# Patient Record
Sex: Male | Born: 2000 | Race: Black or African American | Hispanic: No | Marital: Single
Health system: Southern US, Community
[De-identification: ages and names within clinical notes are randomized; demographics above are authoritative.]

## PROBLEM LIST (undated history)

## (undated) DIAGNOSIS — R4020 Unspecified coma: Secondary | ICD-10-CM

## (undated) DIAGNOSIS — R002 Palpitations: Secondary | ICD-10-CM

## (undated) HISTORY — DX: Unspecified coma: R40.20

## (undated) HISTORY — DX: Palpitations: R00.2

---
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
## 2019-02-16 ENCOUNTER — Ambulatory Visit: Payer: Self-pay | Attending: Internal Medicine

## 2019-02-16 DIAGNOSIS — Z20822 Contact with and (suspected) exposure to covid-19: Secondary | ICD-10-CM

## 2019-02-16 DIAGNOSIS — Z20828 Contact with and (suspected) exposure to other viral communicable diseases: Secondary | ICD-10-CM | POA: Insufficient documentation

## 2019-02-18 LAB — NOVEL CORONAVIRUS, NAA: SARS-CoV-2, NAA: NOT DETECTED

## 2019-06-13 ENCOUNTER — Emergency Department (HOSPITAL_COMMUNITY): Payer: 59

## 2019-06-13 ENCOUNTER — Emergency Department (HOSPITAL_COMMUNITY)
Admission: EM | Admit: 2019-06-13 | Discharge: 2019-06-13 | Disposition: A | Discharge status: Home / Self Care | Payer: 59 | Attending: Emergency Medicine | Admitting: Emergency Medicine

## 2019-06-13 ENCOUNTER — Other Ambulatory Visit: Payer: Self-pay

## 2019-06-13 ENCOUNTER — Encounter (HOSPITAL_COMMUNITY): Payer: Self-pay | Admitting: Obstetrics and Gynecology

## 2019-06-13 DIAGNOSIS — F1210 Cannabis abuse, uncomplicated: Secondary | ICD-10-CM | POA: Insufficient documentation

## 2019-06-13 DIAGNOSIS — M542 Cervicalgia: Secondary | ICD-10-CM | POA: Diagnosis present

## 2019-06-13 DIAGNOSIS — Y92410 Unspecified street and highway as the place of occurrence of the external cause: Secondary | ICD-10-CM | POA: Insufficient documentation

## 2019-06-13 DIAGNOSIS — M25512 Pain in left shoulder: Secondary | ICD-10-CM | POA: Insufficient documentation

## 2019-06-13 DIAGNOSIS — M549 Dorsalgia, unspecified: Secondary | ICD-10-CM | POA: Insufficient documentation

## 2019-06-13 DIAGNOSIS — M25532 Pain in left wrist: Secondary | ICD-10-CM | POA: Diagnosis not present

## 2019-06-13 DIAGNOSIS — Y9389 Activity, other specified: Secondary | ICD-10-CM | POA: Insufficient documentation

## 2019-06-13 DIAGNOSIS — Y998 Other external cause status: Secondary | ICD-10-CM | POA: Insufficient documentation

## 2019-06-13 MED ORDER — METHOCARBAMOL 500 MG PO TABS: 500.0000 mg | ORAL_TABLET | Freq: Two times a day (BID) | ORAL | 0 refills | Status: DC

## 2019-06-13 MED ORDER — OXYCODONE-ACETAMINOPHEN 5-325 MG PO TABS
1.0000 | ORAL_TABLET | Freq: Once | ORAL | Status: AC
  Administered 2019-06-13: 17:00:00 1 via ORAL
  Filled 2019-06-13: qty 1

## 2019-06-13 NOTE — Discharge Instructions (Signed)
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
## 2019-10-13 ENCOUNTER — Emergency Department (HOSPITAL_COMMUNITY)
Admission: EM | Admit: 2019-10-13 | Discharge: 2019-10-13 | Disposition: A | Discharge status: Home / Self Care | Payer: 59 | Attending: Emergency Medicine | Admitting: Emergency Medicine

## 2019-10-13 ENCOUNTER — Emergency Department (HOSPITAL_COMMUNITY): Payer: 59

## 2019-10-13 ENCOUNTER — Other Ambulatory Visit: Payer: Self-pay

## 2019-10-13 DIAGNOSIS — R05 Cough: Secondary | ICD-10-CM | POA: Diagnosis not present

## 2019-10-13 DIAGNOSIS — Z5321 Procedure and treatment not carried out due to patient leaving prior to being seen by health care provider: Secondary | ICD-10-CM | POA: Diagnosis not present

## 2019-10-13 DIAGNOSIS — R0789 Other chest pain: Secondary | ICD-10-CM | POA: Insufficient documentation

## 2019-10-13 LAB — CBC
HCT: 45.9 % (ref 39.0–52.0)
Hemoglobin: 15.5 g/dL (ref 13.0–17.0)
MCH: 31.1 pg (ref 26.0–34.0)
MCHC: 33.8 g/dL (ref 30.0–36.0)
MCV: 92 fL (ref 80.0–100.0)
Platelets: 253 10*3/uL (ref 150–400)
RBC: 4.99 MIL/uL (ref 4.22–5.81)
RDW: 12.4 % (ref 11.5–15.5)
WBC: 5 10*3/uL (ref 4.0–10.5)
nRBC: 0 % (ref 0.0–0.2)

## 2019-10-13 LAB — BASIC METABOLIC PANEL
Anion gap: 7 (ref 5–15)
BUN: 11 mg/dL (ref 6–20)
CO2: 28 mmol/L (ref 22–32)
Calcium: 9.4 mg/dL (ref 8.9–10.3)
Chloride: 102 mmol/L (ref 98–111)
Creatinine, Ser: 0.79 mg/dL (ref 0.61–1.24)
GFR calc Af Amer: >60 mL/min (ref >60)
GFR calc non Af Amer: >60 mL/min (ref >60)
Glucose, Bld: 81 mg/dL (ref 70–99)
Potassium: 4.1 mmol/L (ref 3.5–5.1)
Sodium: 137 mmol/L (ref 135–145)

## 2019-10-13 NOTE — ED Triage Notes (Signed)
Pt c/o cont throbbing right sided chest pain 7/10, reproducible with deep breathing x1 day, and non productive cough since this morning.  No cardiac hx.

## 2019-10-16 ENCOUNTER — Encounter (HOSPITAL_COMMUNITY): Payer: Self-pay | Admitting: Emergency Medicine

## 2019-10-16 ENCOUNTER — Emergency Department (HOSPITAL_COMMUNITY)
Admission: EM | Admit: 2019-10-16 | Discharge: 2019-10-16 | Disposition: A | Discharge status: Home / Self Care | Payer: 59 | Attending: Emergency Medicine | Admitting: Emergency Medicine

## 2019-10-16 ENCOUNTER — Other Ambulatory Visit: Payer: Self-pay

## 2019-10-16 DIAGNOSIS — Z79899 Other long term (current) drug therapy: Secondary | ICD-10-CM | POA: Diagnosis not present

## 2019-10-16 DIAGNOSIS — R55 Syncope and collapse: Secondary | ICD-10-CM | POA: Diagnosis not present

## 2019-10-16 DIAGNOSIS — R002 Palpitations: Secondary | ICD-10-CM | POA: Diagnosis present

## 2019-10-16 LAB — BASIC METABOLIC PANEL
Anion gap: 8 (ref 5–15)
BUN: 7 mg/dL (ref 6–20)
CO2: 27 mmol/L (ref 22–32)
Calcium: 9.2 mg/dL (ref 8.9–10.3)
Chloride: 104 mmol/L (ref 98–111)
Creatinine, Ser: 0.97 mg/dL (ref 0.61–1.24)
GFR calc Af Amer: >60 mL/min (ref >60)
GFR calc non Af Amer: >60 mL/min (ref >60)
Glucose, Bld: 84 mg/dL (ref 70–99)
Potassium: 3.7 mmol/L (ref 3.5–5.1)
Sodium: 139 mmol/L (ref 135–145)

## 2019-10-16 LAB — CBC
HCT: 42.4 % (ref 39.0–52.0)
Hemoglobin: 14.1 g/dL (ref 13.0–17.0)
MCH: 30.4 pg (ref 26.0–34.0)
MCHC: 33.3 g/dL (ref 30.0–36.0)
MCV: 91.4 fL (ref 80.0–100.0)
Platelets: 244 10*3/uL (ref 150–400)
RBC: 4.64 MIL/uL (ref 4.22–5.81)
RDW: 12.3 % (ref 11.5–15.5)
WBC: 4.8 10*3/uL (ref 4.0–10.5)
nRBC: 0 % (ref 0.0–0.2)

## 2019-10-16 LAB — TSH: TSH: 1.882 u[IU]/mL (ref 0.350–4.500)

## 2019-10-16 LAB — TROPONIN I (HIGH SENSITIVITY): Troponin I (High Sensitivity): 2 ng/L (ref <18)

## 2019-10-16 NOTE — ED Provider Notes (Signed)
[facility] EMERGENCY DEPARTMENT Provider Note   CSN: [id number] Arrival date & time: 10/16/19  1646     History Chief Complaint  Patient presents with  . Loss of Consciousness    Matthew Allen is a 19 y.o. male.  HPI   This patient is a 19 year old male, he has no significant prior medical history including hypertension diabetes tobacco use alcohol use (rarely), and he has no history of drug abuse.  He does use a CBD variant occasionally.  The patient reports that he is pretty healthy, he used to go to the gym very often until he had to go to the chiropractor several months ago, since then he has not been back to the gym very much.  He reports that on Friday he was having some chest discomfort, and seem to be fleeting and went away, he had gone to the emergency department but because of the extended wait times he decided to go home.  Then again today when he was at work he started to feel like he was lightheaded, near syncopal, became diaphoretic, folic his heart was racing and then had a period where he kind of collapsed to the ground feeling weak in all 4 extremities, numb in all 4 extremities.  Paramedics were called and transported the patient to the emergency department with normal vital signs and a normal blood sugar.  At this time the patient is totally asymptomatic.  There has been no unexpected weight loss, weight gain, constipation, diarrhea, rashes and he states he has not used any stimulant medications including over-the-counter cough and cold medicines, allergy medications or energy drinks.  He specifically denies cocaine and tobacco  Past Medical History:  Diagnosis Date  . Concussion     There are no problems to display for this patient.   History reviewed. No pertinent surgical history.     No family history on file.  Social History   Tobacco Use  . Smoking status: Never Smoker  . Smokeless tobacco: Never Used  Vaping Use  . Vaping Use:  Every day  . Substances: THC, CBD, Flavoring, Nicotine-salt  Substance Use Topics  . Alcohol use: Yes    Comment: Socially  . Drug use: Yes    Types: Marijuana    Home Medications Prior to Admission medications   Medication Sig Start Date End Date Taking? Authorizing Provider  methocarbamol (ROBAXIN) 500 MG tablet Take 1 tablet (500 mg total) by mouth 2 (two) times daily. 06/13/19   Maxwell Caul, PA-C  ondansetron (ZOFRAN ODT) 4 MG disintegrating tablet Take 1 tablet (4 mg total) by mouth every 8 (eight) hours as needed for nausea or vomiting. 09/07/15   Alvira Monday, MD  oxyCODONE-acetaminophen (PERCOCET/ROXICET) 5-325 MG per tablet Take 1-2 tablets by mouth every 6 (six) hours as needed for pain. 07/31/12   Niel Hummer, MD  ranitidine (ZANTAC) 150 MG capsule Take 1 capsule (150 mg total) by mouth daily. 09/07/15   Alvira Monday, MD    Allergies    Patient has no known allergies.  Review of Systems   Review of Systems  All other systems reviewed and are negative.   Physical Exam Updated Vital Signs BP 120/79 (BP Location: Left Arm)   Pulse (!) 58   Temp 98.7 F (37.1 C) (Oral)   Resp 18   Ht 1.753 m (5\' 9" )   Wt 88.5 kg   SpO2 100%   BMI 28.81 kg/m   Physical Exam Vitals and nursing note  reviewed.  Constitutional:      General: He is not in acute distress.    Appearance: He is well-developed.  HENT:     Head: Normocephalic and atraumatic.     Mouth/Throat:     Pharynx: No oropharyngeal exudate.  Eyes:     General: No scleral icterus.       Right eye: No discharge.        Left eye: No discharge.     Conjunctiva/sclera: Conjunctivae normal.     Pupils: Pupils are equal, round, and reactive to light.  Neck:     Thyroid: No thyromegaly.     Vascular: No JVD.  Cardiovascular:     Rate and Rhythm: Normal rate and regular rhythm.     Heart sounds: Normal heart sounds. No murmur heard.  No friction rub. No gallop.   Pulmonary:     Effort: Pulmonary  effort is normal. No respiratory distress.     Breath sounds: Normal breath sounds. No wheezing or rales.  Abdominal:     General: Bowel sounds are normal. There is no distension.     Palpations: Abdomen is soft. There is no mass.     Tenderness: There is no abdominal tenderness.  Musculoskeletal:        General: No tenderness. Normal range of motion.     Cervical back: Normal range of motion and neck supple.  Lymphadenopathy:     Cervical: No cervical adenopathy.  Skin:    General: Skin is warm and dry.     Findings: No erythema or rash.  Neurological:     Mental Status: He is alert.     Coordination: Coordination normal.     Comments: Normal speech coordination strength gait and facial symmetry  Psychiatric:        Behavior: Behavior normal.     ED Results / Procedures / Treatments   Labs (all labs ordered are listed, but only abnormal results are displayed) Labs Reviewed  BASIC METABOLIC PANEL  CBC  URINALYSIS, ROUTINE W REFLEX MICROSCOPIC  TSH  TROPONIN I (HIGH SENSITIVITY)    EKG None  Radiology No results found.  Procedures Procedures (including critical care time)  Medications Ordered in ED Medications - No data to display  ED Course  I have reviewed the triage vital signs and the nursing notes.  Pertinent labs & imaging results that were available during my care of the patient were reviewed by me and considered in my medical decision making (see chart for details).    MDM Rules/Calculators/A&P                          No edema, no tachycardia, normal lung sounds, normal heart sounds, normal pulses, no JVD  This patient has an EKG which is unremarkable, no signs of Brugada or Wellens or any other ischemia or arrhythmia.  He does have a history of some palpitations with this event on Friday and today, he is not currently having no symptoms.  I think he needs to follow-up with cardiology for possible Holter monitor testing, I will add a TSH, the patient  is stable for discharge and aware of the indications for return  The patient has a follow-up with a family doctor tomorrow, he will be given a copy of his EKG as well as his lab work to take with him  Final Clinical Impression(s) / ED Diagnoses Final diagnoses:  Syncope and collapse  Palpitations  Eber Hong, MD 10/16/19 2008

## 2019-10-16 NOTE — ED Triage Notes (Signed)
BIB EMS from work where patient had syncopal episode. Prior to that patient reported dizziness and CP. Patient in NAD.

## 2019-10-16 NOTE — ED Notes (Signed)
Pt sitting outside. 

## 2019-10-16 NOTE — Discharge Instructions (Signed)
Please see your doctor tomorrow at your appointment, you may share with them the EKG and blood work which I have given you, please come back to the emergency department immediately if you develop severe or worsening symptoms including recurrent chest pain palpitations or passing out.  You will need to see the cardiologist, please call the number above for the next available appointment.  You may need a Holter monitor test

## 2019-10-27 ENCOUNTER — Ambulatory Visit (INDEPENDENT_AMBULATORY_CARE_PROVIDER_SITE_OTHER): Payer: 59 | Admitting: Internal Medicine

## 2019-10-27 ENCOUNTER — Telehealth: Payer: Self-pay | Admitting: Radiology

## 2019-10-27 ENCOUNTER — Other Ambulatory Visit: Payer: Self-pay

## 2019-10-27 ENCOUNTER — Encounter: Payer: Self-pay | Admitting: Internal Medicine

## 2019-10-27 VITALS — BP 130/70 | HR 67 | Ht 69.0 in | Wt 210.0 lb

## 2019-10-27 DIAGNOSIS — R55 Syncope and collapse: Secondary | ICD-10-CM | POA: Diagnosis not present

## 2019-10-27 NOTE — Telephone Encounter (Signed)
Enrolled patient for a 14 day Zio XT  monitor to be mailed to patients home  °

## 2019-10-27 NOTE — Patient Instructions (Signed)
Medication Instructions:  Your physician recommends that you continue on your current medications as directed. Please refer to the Current Medication list given to you today.  *If you need a refill on your cardiac medications before your next appointment, please call your pharmacy*   Lab Work: None  If you have labs (blood work) drawn today and your tests are completely normal, you will receive your results only by: Marland Kitchen MyChart Message (if you have MyChart) OR . A paper copy in the mail If you have any lab test that is abnormal or we need to change your treatment, we will call you to review the results.   Testing/Procedures: Your physician has requested that you have an echocardiogram. Echocardiography is a painless test that uses sound waves to create images of your heart. It provides your doctor with information about the size and shape of your heart and how well your heart's chambers and valves are working. This procedure takes approximately one hour. There are no restrictions for this procedure.  Your physician has recommended that you wear a 14 day monitor. These monitors are medical devices that record the heart's electrical activity. Doctors most often use these monitors to diagnose arrhythmias. Arrhythmias are problems with the speed or rhythm of the heartbeat. The monitor is a small, portable device. You can wear one while you do your normal daily activities. This is usually used to diagnose what is causing palpitations/syncope (passing out).  Follow-Up: AS NEEDED   Other Instructions ZIO XT- Long Term Monitor Instructions   Your physician has requested you wear your ZIO patch monitor 14 days.   This is a single patch monitor.  [employer] supplies one patch monitor per enrollment.  Additional stickers are not available.   Please do not apply patch if you will be having a Nuclear Stress Test, Echocardiogram, Cardiac CT, MRI, or Chest Xray during the time frame you would be wearing  the monitor. The patch cannot be worn during these tests.  You cannot remove and re-apply the ZIO XT patch monitor.   Your ZIO patch monitor will be sent USPS Priority mail from [facility] directly to your home address. The monitor may also be mailed to a PO BOX if home delivery is not available.   It may take 3-5 days to receive your monitor after you have been enrolled.   Once you have received you monitor, please review enclosed instructions.  Your monitor has already been registered assigning a specific monitor serial # to you.   Applying the monitor   Shave hair from upper left chest.   Hold abrader disc by orange tab.  Rub abrader in 40 strokes over left upper chest as indicated in your monitor instructions.   Clean area with 4 enclosed alcohol pads .  Use all pads to assure are is cleaned thoroughly.  Let dry.   Apply patch as indicated in monitor instructions.  Patch will be place under collarbone on left side of chest with arrow pointing upward.   Rub patch adhesive wings for 2 minutes.Remove white label marked "1".  Remove white label marked "2".  Rub patch adhesive wings for 2 additional minutes.   While looking in a mirror, press and release button in center of patch.  A small green light will flash 3-4 times .  This will be your only indicator the monitor has been turned on.     Do not shower for the first 24 hours.  You may shower after the first 24  hours.   Press button if you feel a symptom. You will hear a small click.  Record Date, Time and Symptom in the Patient Log Book.   When you are ready to remove patch, follow instructions on last 2 pages of Patient Log Book.  Stick patch monitor onto last page of Patient Log Book.   Place Patient Log Book in [place] box.  Use locking tab on box and tape box closed securely.  The [employer] and [employer] has [employer] on it.  Please place in mailbox as soon as possible.  Your physician should have your test results  approximately 7 days after the monitor has been mailed back to [facility].   Call [facility] Customer Care at [phone] if you have questions regarding your ZIO XT patch monitor.  Call them immediately if you see an orange light blinking on your monitor.   If your monitor falls off in less than 4 days contact our Monitor department at [phone].  If your monitor becomes loose or falls off after 4 days call [employer] at [phone] for suggestions on securing your monitor.

## 2019-10-27 NOTE — Progress Notes (Signed)
Cardiology Office Note:    Date:  10/27/2019   ID:  Matthew Allen, DOB 01/29/2001, MRN [id number]  PCP:  Leilani Able, MD  [facility] Cardiologist:  No primary care provider on file.  [facility] Electrophysiologist:  None   Referring MD: Eber Hong, MD   No chief complaint on file. Seen at the behest of Dr. Eber Hong in consultation for syncope.  History of Present Illness:    Matthew Allen is a 19 y.o. male with episode of syncope 09/2019.  Patient had feel generalized fatigue and chest tightness and chest pain prior to 10/13/19.  Positional and worse with inspiration.  No shortness of breath.  Continued to worsen with chest tightness and pain.  No fevers or chills, noted dry sinuses.  Considered an urgent care.  At work, goes to walk to the back of the room, his head starts swimming, and feels faint/presyncopal.  Feels his heart race and throb, felt numb.  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  Seen in ED:  Sugars ok.  ECG no acute pathology.  No issues since.  Chest pain resolved, no near syncope or syncope since.  No palpitations since. Family history fo syncope and death as bellow.  Past Medical History:  Diagnosis Date  . Concussion   . Loss of consciousness (HCC)   . Palpitations     No past surgical history on file.  Current Medications: Current Meds  Medication Sig  . acetaminophen (TYLENOL) 500 MG tablet Take 500 mg by mouth as needed for headache.  . Biotin 1000 MCG CHEW Chew by mouth daily.  Marland Kitchen ibuprofen (ADVIL) 600 MG tablet Take 600 mg by mouth as needed.   Allergies:   Patient has no known allergies.   Social History   Socioeconomic History  . Marital status: Single    Spouse name: Not on file  . Number of children: Not on file  . Years of education: Not on file  . Highest education level: Not on file  Occupational History  . Not on file  Tobacco Use  . Smoking status: Never  Smoker  . Smokeless tobacco: Never Used  Vaping Use  . Vaping Use: Every day  . Substances: THC, CBD, Flavoring, Nicotine-salt  Substance and Sexual Activity  . Alcohol use: Yes    Comment: Socially  . Drug use: Yes    Types: Marijuana  . Sexual activity: Yes  Other Topics Concern  . Not on file  Social History Narrative  . Not on file   Social Determinants of Health   Financial Resource Strain:   . Difficulty of Paying Living Expenses: Not on file  Food Insecurity:   . Worried About Programme researcher, broadcasting/film/video in the Last Year: Not on file  . Ran Out of Food in the Last Year: Not on file  Transportation Needs:   . Lack of Transportation (Medical): Not on file  . Lack of Transportation (Non-Medical): Not on file  Physical Activity:   . Days of Exercise per Week: Not on file  . Minutes of Exercise per Session: Not on file  Stress:   . Feeling of Stress : Not on file  Social Connections:   . Frequency of Communication with Friends and Family: Not on file  . Frequency of Social Gatherings with Friends and Family: Not on file  . Attends Religious Services: Not on file  . Active Member of Clubs or  Organizations: Not on file  . Attends Banker Meetings: Not on file  . Marital Status: Not on file    Family History: The patient's family history is positive for: Mother had passing out/near passing out spells.   No sudden cardiac death.   (maternal) grandfather died in 9s/50s;   ROS:   Please see the history of present illness.    All other systems reviewed and are negative.  EKGs/Labs/Other Studies Reviewed:    The following studies were reviewed today:  10/26/19:  Sinus bradycardia iRBBB, Anterior TWI without Epsilon wave or RVH or Right Axis Deviation 10/16/19:  Sinus bradycardia anterior TWI, no Epsilon wave  Recent Labs: 10/16/2019: BUN 7; Creatinine, Ser 0.97; Hemoglobin 14.1; Platelets 244; Potassium 3.7; Sodium 139; TSH 1.882  Recent Lipid Panel No results  found for: CHOL, TRIG, HDL, CHOLHDL, VLDL, LDLCALC, LDLDIRECT  Physical Exam:    VS:  BP 130/70   Pulse 67   Ht 5\' 9"  (1.753 m)   Wt 210 lb (95.3 kg)   SpO2 100%   BMI 31.01 kg/m     Wt Readings from Last 3 Encounters:  10/27/19 210 lb (95.3 kg)  10/16/19 195 lb 1.7 oz (88.5 kg)  06/13/19 195 lb (88.5 kg)     GEN: Well nourished, well developed in no acute distress HEENT: Normal NECK: No JVD; No carotid bruits LYMPHATICS: No lymphadenopathy CARDIAC: RRR, no murmurs, rubs, gallops RESPIRATORY:  Clear to auscultation without rales, wheezing or rhonchi  ABDOMEN: Soft, non-tender, non-distended MUSCULOSKELETAL:  No edema; No deformity  SKIN: Warm and dry NEUROLOGIC:  Alert and oriented x 3 PSYCHIATRIC:  Normal affect; occasional hyperactive   ASSESSMENT:    1. Syncope and collapse    PLAN:    In order of problems listed above:  1. Syncope -  Given family history and EKG will order echocardiogram and ZIo Patch - if RV is normal and no other significant pathology, can follow PRN  Medication Adjustments/Labs and Tests Ordered: Current medicines are reviewed at length with the patient today.  Concerns regarding medicines are outlined above.  No orders of the defined types were placed in this encounter.  No orders of the defined types were placed in this encounter.   There are no Patient Instructions on file for this visit.   Signed, 06/15/19, MD  10/27/2019 2:37 PM    [facility]

## 2019-11-14 ENCOUNTER — Other Ambulatory Visit (HOSPITAL_COMMUNITY): Payer: 59

## 2019-11-17 ENCOUNTER — Other Ambulatory Visit (HOSPITAL_COMMUNITY): Payer: 59

## 2019-12-07 ENCOUNTER — Telehealth (HOSPITAL_COMMUNITY): Payer: Self-pay | Admitting: Internal Medicine

## 2019-12-07 ENCOUNTER — Other Ambulatory Visit (HOSPITAL_COMMUNITY): Payer: 59

## 2019-12-07 NOTE — Telephone Encounter (Signed)
Attempted to contact patient to see if we can reschedule his echocardiogram but there was no answer and VM was full.   Apparently we cancelled patient's echo appt once and he cancelled his appt twice.

## 2019-12-07 NOTE — Telephone Encounter (Signed)
Patient has called and cancelled his echocardiogram on 3 different occassions now with less than 24 hour notice:  11/14/19 11/17/19 12/07/2019  Order will be removed from the Echo WQ and if patient calls back to reschedule we will reinstate the order.

## 2020-05-30 NOTE — Telephone Encounter (Signed)
Monitor was never returned and marked "lost" in the Zio system. Order will be cancelled 

## 2021-06-13 IMAGING — CR DG CHEST 2V
2 series · 2 of 2 positions shown · non-contrast
Comparison: August 06, 2008

CLINICAL DATA: Chest pain

EXAM:
CHEST - 2 VIEW

[w chest pa]
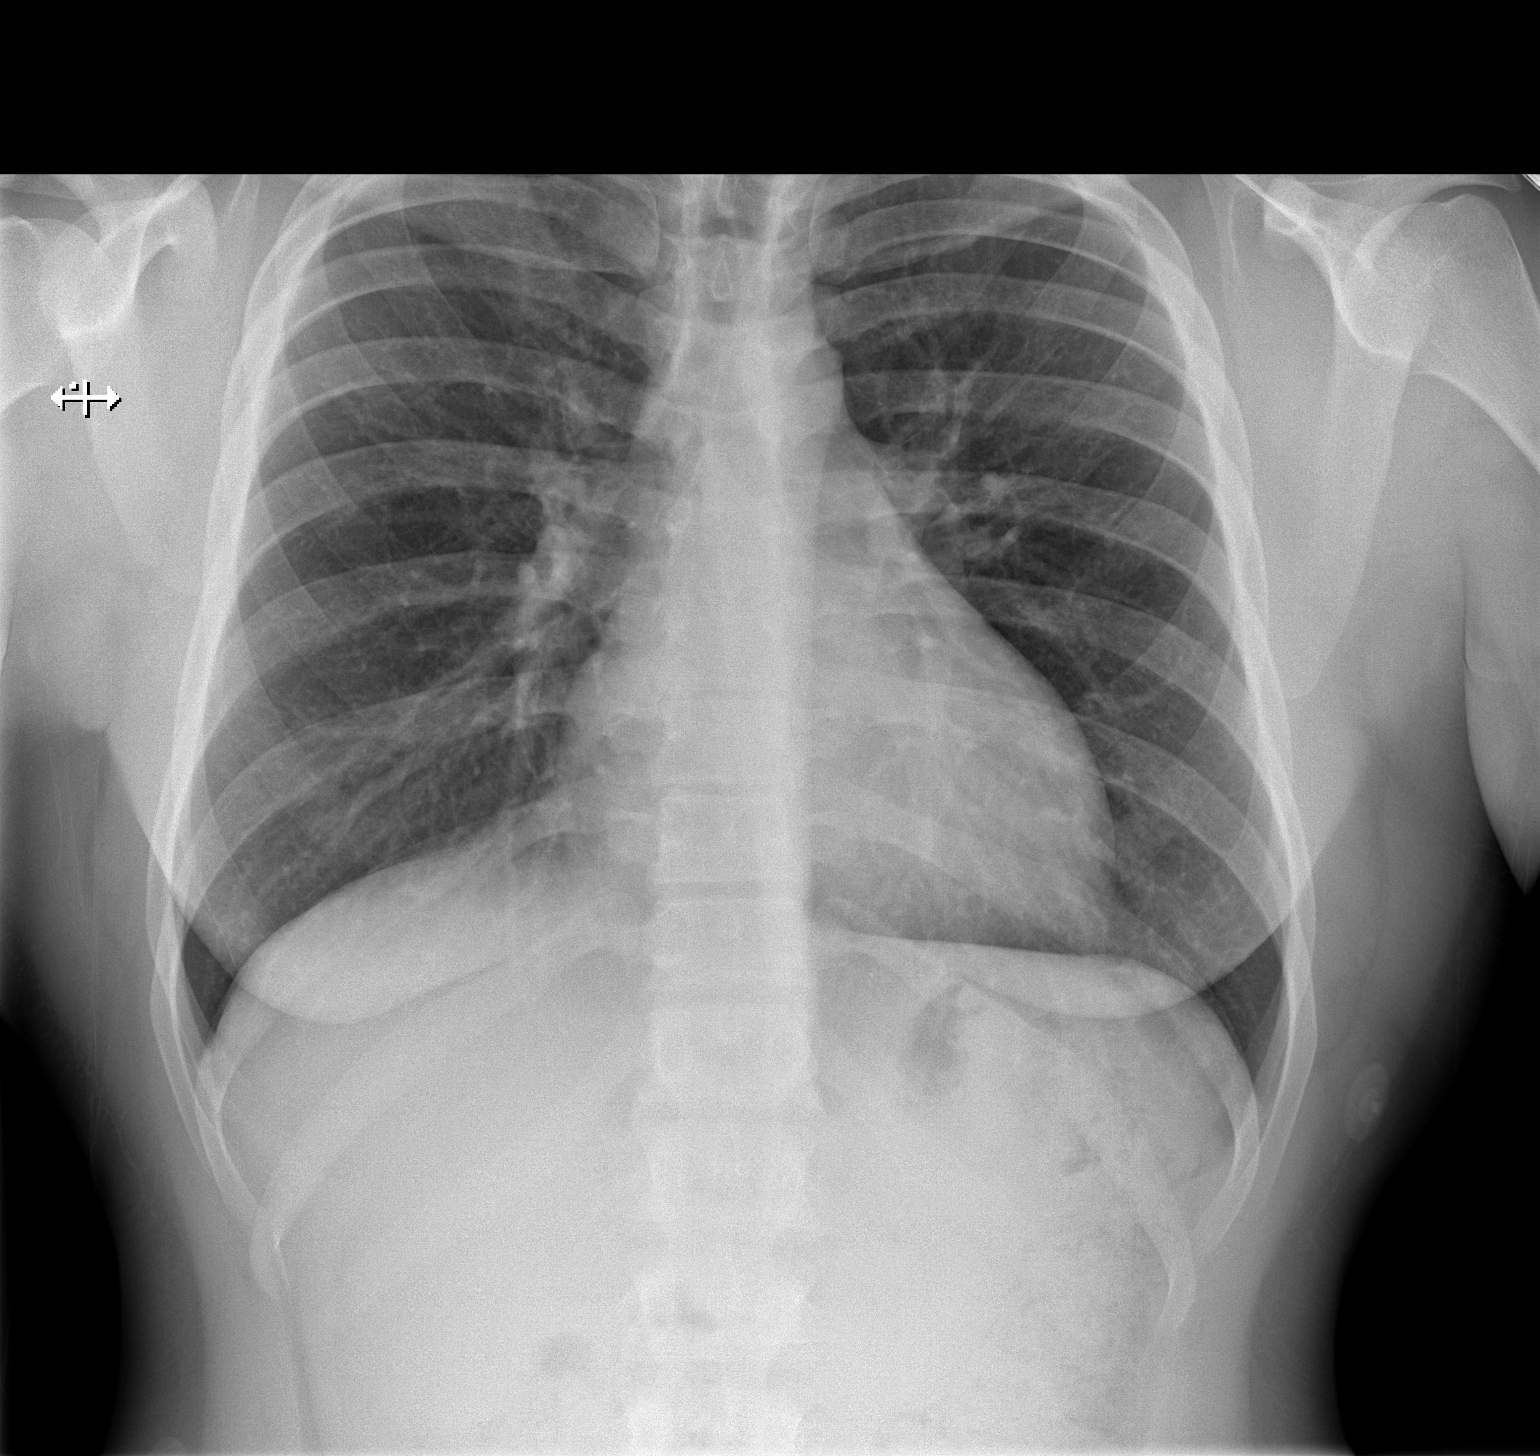

[w chest lat]
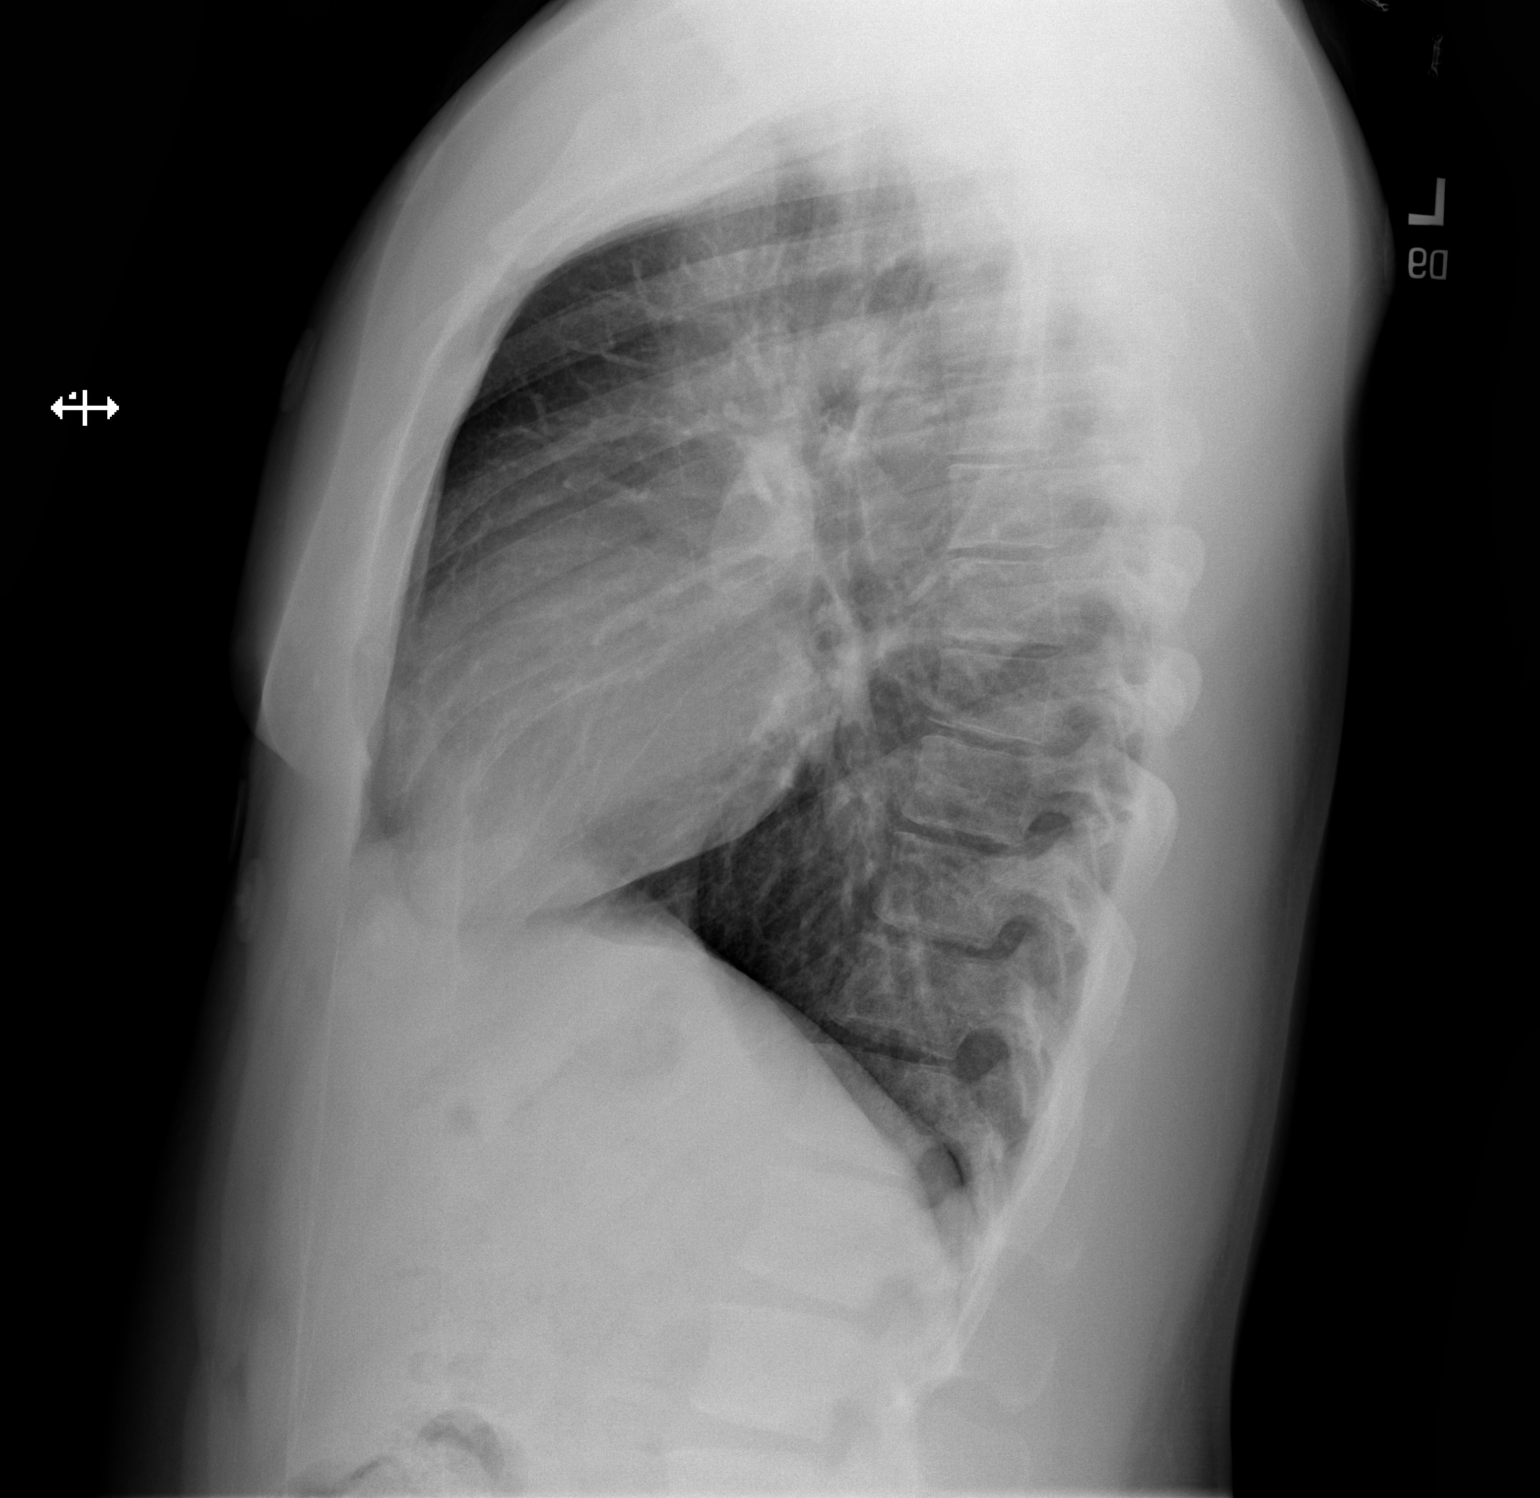

[2 of 2 positions shown; findings below may reference images not displayed]

FINDINGS: The lungs are clear. The heart size and pulmonary vascularity are
normal. No adenopathy. No pneumothorax. No bone lesions.
IMPRESSION: Lungs clear.  Cardiac silhouette within normal limits.

## 2022-01-14 ENCOUNTER — Encounter (INDEPENDENT_AMBULATORY_CARE_PROVIDER_SITE_OTHER): Payer: Self-pay

## 2022-01-14 ENCOUNTER — Ambulatory Visit (INDEPENDENT_AMBULATORY_CARE_PROVIDER_SITE_OTHER): Payer: BC Managed Care – PPO | Admitting: Internal Medicine

## 2022-01-14 VITALS — BP 125/74 | HR 90 | Temp 98.0°F | Resp 18 | Ht 68.0 in | Wt 245.0 lb

## 2022-01-14 DIAGNOSIS — R6883 Chills (without fever): Secondary | ICD-10-CM

## 2022-01-14 DIAGNOSIS — B349 Viral infection, unspecified: Secondary | ICD-10-CM

## 2022-01-14 LAB — POCT INFLUENZA A/B
POCT Rapid Influenza A AG: NEGATIVE
POCT Rapid Influenza B AG: NEGATIVE

## 2022-01-14 NOTE — Progress Notes (Signed)
[facility]  PROGRESS NOTE     Patient: Ian Palmer   Date: 01/14/2022   MRN: [id number]       Pollux Eye is a 21 y.o. male      HISTORY     History obtained from: Patient    Chief Complaint   Patient presents with    Fatigue     Sx x Sunday the 26th of Nov  fever, chills, malise, ear and eye congestion  home Covid (-)        HPI 21 yo M with Hx of ADHD, on meds  C/O acute onset on 11-26 of F/C,  waking up in sweats.  Pharyngitis has resolved.  Ears feel full.  No N/V/D, SOB or CP.  COVID vaxxed.  Dayquil, Nyquil, Tylenol and Alka seltzer with incomplete relief     Review of Systems  All other systems reviewed and are negative.  As above.     History:    Pertinent Past Medical, Surgical, Family and Social History were reviewed.        Current Outpatient Medications:     amphetamine-dextroamphetamine (ADDERALL) 20 MG tablet, Take 1 tablet by mouth daily, Disp: , Rfl:     No Known Allergies    Medications and Allergies reviewed.    PHYSICAL EXAM     Vitals:    01/14/22 1108   BP: 125/74   Pulse: 90   Resp: 18   Temp: 98 F (36.7 C)   TempSrc: Temporal   SpO2: 99%   Weight: 111.1 kg (245 lb)   Height: 1.727 m ('5\' 8"'$ )       Physical Exam  HENT:      Right Ear: Tympanic membrane, ear canal and external ear normal. There is no impacted cerumen.      Left Ear: Tympanic membrane, ear canal and external ear normal. There is no impacted cerumen.      Nose: Rhinorrhea present. No congestion.      Mouth/Throat:      Mouth: Mucous membranes are moist.      Pharynx: Oropharynx is clear. No oropharyngeal exudate or posterior oropharyngeal erythema.   Pulmonary:      Effort: Pulmonary effort is normal. No respiratory distress.      Breath sounds: Normal breath sounds. No stridor. No wheezing, rhonchi or rales.       Vitals and nursing note reviewed.   Constitutional:       General: Not in acute distress.     Appearance: Normal appearance. Not ill-appearing or toxic-appearing.   HENT:      Head: Normocephalic and  atraumatic.   Eyes:      Conjunctiva/sclera: Conjunctivae normal.   Neck:      Musculoskeletal: Normal range of motion.   Respiratory:      Normal effort. Able to speak in full sentences.  Neurological:      Mental Status: Alert and oriented.  Psychiatric:         Mood and Affect: Mood normal.         Behavior: Behavior normal.     UCC COURSE     LABS  The following POCT tests were ordered, reviewed and discussed with the patient/family.     Results       Procedure Component Value Units Date/Time    POCT Influenza A/B [id number]  (Normal) Collected: 01/14/22 1144     Updated: 01/14/22 1144     POCT QC Pass  POCT Rapid Influenza A AG Negative     POCT Rapid Influenza B AG Negative            There were no x-rays reviewed with this patient during the visit.    No current facility-administered medications for this visit.       PROCEDURES     Procedures    MEDICAL DECISION MAKING     History, physical, labs/studies most consistent with acute viral syndrome as the diagnosis.    Chart Review:  Prior PCP, Specialist and/or ED notes reviewed today: No  Prior labs/images/studies reviewed today: No    Differential Diagnosis: COVID 19; Flu; Acute viral syndrome; Strep; acute bronchitis - viral, bacterial, allergic       ASSESSMENT     Encounter Diagnoses   Name Primary?    Chills     Acute viral syndrome Yes                PLAN      PLAN: Tylenol / ibuprofen; Push fluids; Nasal saline; Nasal steroid; Mucinex DM; F/U with PCP in 2-3 days.              Orders Placed This Encounter   Procedures    POCT Influenza A/B     Requested Prescriptions      No prescriptions requested or ordered in this encounter       Discussed results and diagnosis with patient/family.  Reviewed warning signs for worsening condition, as well as, indications for follow-up with primary care physician and return to urgent care clinic.   Patient/family expressed understanding of instructions.     An After Visit Summary was provided to the patient.

## 2022-01-14 NOTE — Patient Instructions (Signed)
Tylenol and or ibuprofen as directed on the bottles.  Nasal saline several times a day.  Nasal steroid.  Mucinex DM.  Push fluids with electrolytes.  To the Emergency Room or call 911 for any chest pain, difficulty breathing, heart racing, any concern.  Follow up with your doctor in 2-3 days.

## 2022-02-11 ENCOUNTER — Ambulatory Visit (INDEPENDENT_AMBULATORY_CARE_PROVIDER_SITE_OTHER): Payer: BC Managed Care – PPO | Admitting: Physician Assistant

## 2022-02-11 ENCOUNTER — Encounter (INDEPENDENT_AMBULATORY_CARE_PROVIDER_SITE_OTHER): Payer: Self-pay | Admitting: Physician Assistant

## 2022-02-11 VITALS — BP 116/71 | HR 123 | Temp 102.2°F | Resp 13 | Ht 68.0 in | Wt 260.0 lb

## 2022-02-11 DIAGNOSIS — U071 COVID-19: Secondary | ICD-10-CM

## 2022-02-11 DIAGNOSIS — R509 Fever, unspecified: Secondary | ICD-10-CM

## 2022-02-11 LAB — POCT INFLUENZA A/B
POCT Rapid Influenza A AG: NEGATIVE
POCT Rapid Influenza B AG: NEGATIVE

## 2022-02-11 LAB — GOHEALTH POC COVID QUICKVUE ANTIGEN: QuickVue SARS COV2 Antigen POCT: POSITIVE — AB

## 2022-02-11 MED ORDER — ONDANSETRON 4 MG PO TBDP
4.0000 mg | ORAL_TABLET | Freq: Four times a day (QID) | ORAL | 0 refills | Status: AC | PRN
Start: 2022-02-11 — End: 2022-02-18

## 2022-02-11 MED ORDER — NIRMATRELVIR&RITONAVIR 300/100 20 X 150 MG & 10 X 100MG PO TBPK
3.0000 | ORAL_TABLET | Freq: Two times a day (BID) | ORAL | 0 refills | Status: AC
Start: 2022-02-11 — End: 2022-02-16

## 2022-02-11 MED ORDER — IBUPROFEN 200 MG PO TABS
600.0000 mg | ORAL_TABLET | Freq: Once | ORAL | Status: AC
Start: 2022-02-11 — End: 2022-02-11
  Administered 2022-02-11: 600 mg via ORAL

## 2022-02-11 NOTE — Patient Instructions (Signed)
Please take medications as prescribed  Please follow up with primary care provider within (3 days) or follow up in clinic if symptoms worsening or not resolving as expected   If any new or worsening, please to go to nearest Emergency Room

## 2022-02-11 NOTE — Progress Notes (Signed)
Ian Palmer NOTE     Patient: Ian Palmer   Date: 02/11/2022   MRN: [id number]       Seraj Bourgoin is a 21 y.o. male    HISTORY       Chief Complaint   Patient presents with    Flu like symptoms     Pt c/o chills, fatigue, emesis, and malaise since 'Sunday. Pt has taken OTC meds w/ no improvement.          HPI      Patient presents with chills, fatigue, malaise since Sunday.  Patient had 1 episode of emesis after eating food today.  No chest pain or shortness of breath    ROS      12'$  point review system reviewed and negative unless listed above      History:    Pertinent Past Medical, Surgical, Family and Social History were reviewed.       Current Outpatient Medications:     amphetamine-dextroamphetamine (ADDERALL) 20 MG tablet, Take 1 tablet by mouth daily, Disp: , Rfl:     buPROPion (WELLBUTRIN) 75 MG tablet, , Disp: , Rfl:     nirmatrelvir-ritonavir (PAXLOVID) 20 x 150 MG & 10 x '100MG'$  dose pack(emergency use authorization), Take 3 tablets by mouth 2 (two) times daily for 5 days The dosage for PAXLOVID is 300 mg nirmatrelvir (two 150 mg tablets) with 100 mg ritonavir (one 100 mg tablet) with all three tablets taken together., Disp: 30 tablet, Rfl: 0    ondansetron (ZOFRAN-ODT) 4 MG disintegrating tablet, Take 1 tablet (4 mg) by mouth every 6 (six) hours as needed for Nausea, Disp: 8 tablet, Rfl: 0  No current facility-administered medications for this visit.    No Known Allergies    Medications and Allergies reviewed.    PHYSICAL EXAM     Vitals:    02/11/22 1936   BP: 116/71   Pulse: (!) 123   Resp: 13   Temp: (!) 102.2 F (39 C)   TempSrc: Tympanic   SpO2: 96%   Weight: 117.9 kg (260 lb)   Height: 1.727 m ('5\' 8"'$ )       Physical Exam      Nursing note and vitals reviewed.  Constitutional:  Well developed, well nourished. No distress noted.  Head:  Atraumatic. Normocephalic.     Eyes:  PERRL. EOMI. Conjunctivae are not pale.  ENT:  Mucous membranes are moist and intact. Oropharynx is clear and  symmetric.  Patent airway.  Neck:  Supple. Full ROM.     Cardiovascular:  Regular rate. Regular rhythm. No murmurs, rubs, or gallops.  Pulmonary/Chest:  No evidence of respiratory distress. Clear to auscultation bilaterally.  No wheezing, rales or rhonchi.    Abdominal:  Soft and non-distended. There is no tenderness, rebound, guarding, or rigidity.   Back:  Full ROM. Nontender.  Extremities:  No edema. No cyanosis. No clubbing. Full range of motion in all extremities.  Skin:  Skin is warm and dry.  No diaphoresis. No rash.    Neurological:  Alert, awake, and appropriate. Normal speech. Motor normal.  Psychiatric:  Good eye contact. Normal interaction, affect, and behavior.            UCC COURSE     LABS  The following POCT tests were ordered, reviewed and discussed with the patient/family.     Results       Procedure Component Value Units Date/Time    POCT Influenza A/B [id number]  (  Normal) Collected: 02/11/22 1950     Updated: 02/11/22 1951     POCT QC Pass     POCT Rapid Influenza A AG Negative     POCT Rapid Influenza B AG Negative    QuickVue SARS-COV-2 Antigen POCT [id number]  (Abnormal) Collected: 02/11/22 1950    Specimen: Nasal Swab COVID-19 from [facility] Updated: 02/11/22 1950     QuickVue SARS COV2 Antigen POCT Positive            There were no x-rays reviewed with this patient during the visit.    No current facility-administered medications for this visit.          PROCEDURES     Procedures      MEDICAL DECISION MAKING       History, physical and labs most consistent with COVID-19  Differential Diagnosis: Sinusitis, Bronchitis, Pharyngitis, Pneumonia, Influenza, COVID-19 and Allergic Rhinitis        ASSESSMENT     Encounter Diagnoses   Name Primary?    Fever, unspecified     COVID-19 Yes            PLAN           Risks and benefits of therapy discussed,  Pt/family  agrees and understands.  VSS, well appearing, stable for home discharge  Take all prescriptions as prescribed.  Zofran as needed, Paxlovid  prescription  Follow up with primary care provider within 3 days, or follow up in clinic if symptoms worsening or not resolving as expected   ED precautions reviewed, If new, persistent or worsening symptoms, please  go to nearest Emergency Room  Patient verbalized understanding of plan of care and had no further questions/concerns  Discharge instructions discussed, all questions answered.          Orders Placed This Encounter   Procedures    QuickVue SARS-COV-2 Antigen POCT    POCT Influenza A/B     Requested Prescriptions     Signed Prescriptions Disp Refills    ondansetron (ZOFRAN-ODT) 4 MG disintegrating tablet 8 tablet 0     Sig: Take 1 tablet (4 mg) by mouth every 6 (six) hours as needed for Nausea    nirmatrelvir-ritonavir (PAXLOVID) 20 x 150 MG & 10 x '100MG'$  dose pack(emergency use authorization) 30 tablet 0     Sig: Take 3 tablets by mouth 2 (two) times daily for 5 days The dosage for PAXLOVID is 300 mg nirmatrelvir (two 150 mg tablets) with 100 mg ritonavir (one 100 mg tablet) with all three tablets taken together.       Discussed results and diagnosis with patient/family.  Reviewed warning signs for worsening condition, as well as, indications for follow-up with primary care physician,  along with return to urgent care clinic indications..  Strict return to ED precaution discussed.  Patient/family expressed understanding of instructions,all questions answered.       An After Visit Summary was given to the patient.      *This note was generated by the Epic EMR system/ Dragon speech recognition and may contain inherent errors or omissions not intended by the user. Grammatical errors, random word insertions, deletions, pronoun errors and incomplete sentences are occasional consequences of this technology due to software limitations. Not all errors are caught or corrected. If there are questions or concerns about the content of this note or information contained within the body of this dictation they should be  addressed directly with the author for clarification.*

## 2022-02-17 ENCOUNTER — Ambulatory Visit (INDEPENDENT_AMBULATORY_CARE_PROVIDER_SITE_OTHER): Payer: BC Managed Care – PPO | Admitting: Physician Assistant

## 2022-02-17 ENCOUNTER — Encounter (INDEPENDENT_AMBULATORY_CARE_PROVIDER_SITE_OTHER): Payer: Self-pay

## 2022-02-17 VITALS — BP 117/74 | HR 68 | Temp 97.5°F | Resp 16 | Ht 68.0 in | Wt 260.0 lb

## 2022-02-17 DIAGNOSIS — R4189 Other symptoms and signs involving cognitive functions and awareness: Secondary | ICD-10-CM

## 2022-02-17 DIAGNOSIS — U099 Post covid-19 condition, unspecified: Secondary | ICD-10-CM

## 2022-02-17 NOTE — Progress Notes (Signed)
[place]  PROGRESS NOTE     Patient: Ian Palmer   Date: 02/17/2022   MRN: [id number]       Archibald Magnifico is a 22 y.o. male    HISTORY       Chief Complaint   Patient presents with    Fatigue     COVID positive 12/27. Fatigue and some confusion persisting         HPI      History Obtain from patient    22 yr old m presenting with covid. Pt is not confused, just feels a bit of brain fog intermittently. No cp or sob. No f/c. No diarrhea. Nausea resolved.     ROS      12 point review system reviewed and negative unless listed above      History:    Pertinent Past Medical, Surgical, Family and Social History were reviewed.       Current Outpatient Medications:     amphetamine-dextroamphetamine (ADDERALL) 20 MG tablet, Take 1 tablet by mouth daily, Disp: , Rfl:     buPROPion (WELLBUTRIN) 75 MG tablet, , Disp: , Rfl:     ondansetron (ZOFRAN-ODT) 4 MG disintegrating tablet, Take 1 tablet (4 mg) by mouth every 6 (six) hours as needed for Nausea, Disp: 8 tablet, Rfl: 0    No Known Allergies    Medications and Allergies reviewed.    PHYSICAL EXAM     Vitals:    02/17/22 1148   BP: 117/74   Pulse: 68   Resp: 16   Temp: 97.5 F (36.4 C)   SpO2: 98%   Weight: 117.9 kg (260 lb)   Height: 1.727 m ('5\' 8"'$ )       Physical Exam    Nursing note and vitals reviewed.  Constitutional:  Well developed, well nourished. No distress noted.  Head:  Atraumatic. Normocephalic.     Eyes:  PERRL. EOMI. Conjunctivae are not pale.  ENT:  Mucous membranes are moist and intact. Oropharynx is clear and symmetric.  Patent airway.  Neck:  Supple. Full ROM.     Cardiovascular:  Regular rate. Regular rhythm. No murmurs, rubs, or gallops.  Pulmonary/Chest:  No evidence of respiratory distress. Clear to auscultation bilaterally.  No wheezing, rales or rhonchi.    Abdominal:  Soft and non-distended. There is no tenderness, rebound, guarding, or rigidity.   Back:  Full ROM. Nontender.  Extremities:  No edema. No cyanosis. No clubbing. Full range of  motion in all extremities.  Skin:  Skin is warm and dry.  No diaphoresis. No rash.    Neurological:  Alert, awake, and appropriate. Normal speech. Motor normal.  Psychiatric:  Good eye contact. Normal interaction, affect, and behavior.              UCC COURSE     There were no labs reviewed with this patient during the visit.    There were no x-rays reviewed with this patient during the visit.    No current facility-administered medications for this visit.          PROCEDURES     Procedures      MEDICAL DECISION MAKING       History, physical and labs most consistent with fatigue    Differential Diagnosis:brain fog with covid, decrease levels of Wellbutrin effects 2/2 paxlovid, PE,Long COVID (fatigue)    MDM      No abnormal vitas, overall feels quite improved from last visit. Pt requests a few  more day off work. No concerning s/sxs at this time. Fatigue can be 2/2 to viral illness, PCP follow up advised.       ASSESSMENT     Encounter Diagnoses   Name Primary?    Long COVID     Brain fog Yes            PLAN         PLEASE REFER TO MDM  Risks and benefits of therapy discussed,  Pt/family agrees and understands.  VSS, well appearing, stable for home discharge  Follow up with primary care provider within 3 days, or follow up in clinic if symptoms worsening or not resolving as expected   ED precautions reviewed, If new, persistent or worsening symptoms, please  go to nearest Emergency Room  Understanding of plan of care  was verbalized  Discharge instructions discussed,and  no further questions/concerns at time of discharge.         Orders Placed This Encounter   Procedures    Referral to Primary Care     Requested Prescriptions      No prescriptions requested or ordered in this encounter       Discussed results and diagnosis with patient/family.  Reviewed warning signs for worsening condition, as well as, indications for follow-up with primary care physician,  along with return to urgent care clinic indications..   Strict return to ED precaution discussed.  Patient/family expressed understanding of instructions,all questions answered.             *This note was generated by the Epic EMR system/ Dragon speech recognition and may contain inherent errors or omissions not intended by the user. Grammatical errors, random word insertions, deletions, pronoun errors and incomplete sentences are occasional consequences of this technology due to software limitations. Not all errors are caught or corrected. If there are questions or concerns about the content of this note or information contained within the body of this dictation they should be addressed directly with the author for clarification.*

## 2022-02-17 NOTE — Patient Instructions (Addendum)
Please follow up with primary care provider within (3 days) or follow up in clinic if symptoms worsening or not resolving as expected   Please obtain  adequate rest, good sleep hygiene  Please follow up with psychiatrist  If any new or worsening, please to go to nearest Emergency Room

## 2022-03-18 ENCOUNTER — Ambulatory Visit (INDEPENDENT_AMBULATORY_CARE_PROVIDER_SITE_OTHER): Payer: BC Managed Care – PPO | Admitting: Nurse Practitioner

## 2022-03-18 ENCOUNTER — Encounter (INDEPENDENT_AMBULATORY_CARE_PROVIDER_SITE_OTHER): Payer: Self-pay

## 2022-03-18 VITALS — BP 118/81 | HR 88 | Temp 98.5°F | Resp 18 | Ht 69.0 in | Wt 247.0 lb

## 2022-03-18 DIAGNOSIS — R109 Unspecified abdominal pain: Secondary | ICD-10-CM

## 2022-03-18 DIAGNOSIS — R197 Diarrhea, unspecified: Secondary | ICD-10-CM

## 2022-03-18 LAB — MCKESSON POCT UA 120
Bilirubin UA: NEGATIVE
Glucose UA: NEGATIVE
Leukocytes UA: NEGATIVE
Nitrite UA: NEGATIVE
Specific Gravity UA: >=1.03
Urobilinogen UA: NEGATIVE
pH UA: 6

## 2022-03-18 MED ORDER — ONDANSETRON 8 MG PO TBDP
8.0000 mg | ORAL_TABLET | Freq: Three times a day (TID) | ORAL | 0 refills | Status: AC | PRN
Start: 2022-03-18 — End: 2022-03-21

## 2022-03-18 MED ORDER — LOPERAMIDE HCL 2 MG PO TABS
2.0000 mg | ORAL_TABLET | Freq: Four times a day (QID) | ORAL | 0 refills | Status: AC | PRN
Start: 2022-03-18 — End: 2022-03-20

## 2022-03-18 MED ORDER — DICYCLOMINE HCL 20 MG PO TABS
20.0000 mg | ORAL_TABLET | Freq: Three times a day (TID) | ORAL | 0 refills | Status: AC | PRN
Start: 2022-03-18 — End: 2022-03-20

## 2022-03-18 NOTE — Patient Instructions (Signed)
Send stool samples. Rest, hydrate and eat from the BRATT diet. Follow up with the PCP in 1 week, repeat urine test. If having blood in stool, severe abdominal pain, vomiting, fever or lethargy to go to the ER.

## 2022-03-18 NOTE — Progress Notes (Signed)
[place]  CARE  PROGRESS NOTE     Patient: Ian Palmer   Date: 03/18/2022   MRN: [id number]       Ian Palmer is a 22 y.o. male      HISTORY     History obtained from: Patient    Chief Complaint   Patient presents with    GI Problem     ONSET X LAST EVENING, PT STATES SX STARTED AFTER MOST RECENT INJECTION OF WEIGHT LOSS MED.           GI Problem       Patient is a 22 year old male here due to having diarrhea since last night. Started after injecting of saxenda, has been taking for 4 months. Denies recent foreign travel, blood in stool, recent antibiotic use, dizziness, fever, night sweats or chills.    Review of Systems  See HPI  History:    Pertinent Past Medical, Surgical, Family and Social History were reviewed.        Current Outpatient Medications:     amphetamine-dextroamphetamine (ADDERALL) 20 MG tablet, Take 1 tablet by mouth daily, Disp: , Rfl:     buPROPion (WELLBUTRIN) 75 MG tablet, , Disp: , Rfl:     Saxenda 18 MG/3ML injection pen, INJECT '06MG'$  SUBCUTANEOUSLY ONCE A DAY INCREASE BY '06MG'$  EVERY 7 DAYS AS TOLERATED MAX 3MA/DAY, Disp: , Rfl:     No Known Allergies    Medications and Allergies reviewed.    PHYSICAL EXAM     Vitals:    03/18/22 1155   BP: 118/81   Pulse: 88   Resp: 18   Temp: 98.5 F (36.9 C)   SpO2: 98%   Weight: 112 kg (247 lb)   Height: 1.753 m ('5\' 9"'$ )       Physical Exam  Constitutional:       Appearance: Normal appearance.   Cardiovascular:      Rate and Rhythm: Normal rate and regular rhythm.      Pulses: Normal pulses.      Heart sounds: Normal heart sounds.   Pulmonary:      Effort: Pulmonary effort is normal.      Breath sounds: Normal breath sounds.   Abdominal:      General: Abdomen is flat. Bowel sounds are normal.      Palpations: Abdomen is soft.   Neurological:      Mental Status: He is alert.         UCC COURSE     UA-blood, ketone, protein-tx for dehydration with zofran. Tx for diarrhea with bratt, hydrate and loperamine. Sending out stool testing.       PROCEDURES      Procedures    MEDICAL DECISION MAKING     History, physical, labs/studies most consistent with diarrhea as the diagnosis.        Chart Review:  Prior PCP, Specialist and/or ED notes reviewed today: Yes  Prior labs/images/studies reviewed today: Yes          ASSESSMENT     Encounter Diagnosis   Name Primary?    Diarrhea, unspecified type Yes                PLAN      PLAN:             Orders Placed This Encounter   Procedures    Culture, Stool, SALM/SHIGELLA/CAMPY/EHEC    Stool Ova and Parasite Smear    WBC, Stool  Requested Prescriptions      No prescriptions requested or ordered in this encounter       Discussed results and diagnosis with patient/family.  Reviewed warning signs for worsening condition, as well as, indications for follow-up with primary care physician and return to urgent care clinic.   Patient/family expressed understanding of instructions.     An After Visit Summary was provided to the patient.

## 2022-08-24 ENCOUNTER — Ambulatory Visit (INDEPENDENT_AMBULATORY_CARE_PROVIDER_SITE_OTHER): Payer: BC Managed Care – PPO | Admitting: Nurse Practitioner

## 2022-08-24 ENCOUNTER — Encounter (INDEPENDENT_AMBULATORY_CARE_PROVIDER_SITE_OTHER): Payer: Self-pay | Admitting: Nurse Practitioner

## 2022-08-24 VITALS — BP 131/84 | HR 89 | Temp 98.2°F | Resp 17 | Ht 68.0 in | Wt 250.0 lb

## 2022-08-24 DIAGNOSIS — R5383 Other fatigue: Secondary | ICD-10-CM

## 2022-08-24 LAB — MCKESSON POCT UA 120
Bilirubin UA: NEGATIVE
Glucose UA: NEGATIVE
Ketone UA: NEGATIVE
Leukocytes UA: NEGATIVE
Nitrite UA: NEGATIVE
Protein UA: NEGATIVE
Specific Gravity UA: 1.01
Urobilinogen UA: NEGATIVE
pH UA: 6

## 2022-08-24 LAB — POC COVID QUICKVUE ANTIGEN: QuickVue SARS COV2 Antigen POCT: NEGATIVE

## 2022-08-24 LAB — POCT INFLUENZA A/B
POCT Rapid Influenza A AG: NEGATIVE
POCT Rapid Influenza B AG: NEGATIVE

## 2022-08-24 NOTE — Progress Notes (Signed)
[facility]  URGENT  CARE  PROGRESS NOTE     Patient: Ian Palmer   Date: 08/24/2022   MRN: [id number]       Ian Palmer is a 22 y.o. male      HISTORY     History obtained from: Patient    Chief Complaint   Patient presents with    Fatigue     Pt is here c/o server fatigue, x1week          Fatigue  This is a new problem. The current episode started 1 to 4 weeks ago. The problem occurs constantly. The problem has been unchanged. Associated symptoms include fatigue. Pertinent negatives include no chills, congestion, coughing, fever, headaches, myalgias, urinary symptoms or vomiting. Nothing aggravates the symptoms. He has tried nothing for the symptoms.        Review of Systems   Constitutional:  Positive for fatigue. Negative for chills and fever.   HENT:  Negative for congestion.    Respiratory:  Negative for cough.    Gastrointestinal:  Negative for vomiting.   Musculoskeletal:  Negative for myalgias.   Neurological:  Negative for headaches.   All other systems reviewed and are negative.  See HPI    History:    Pertinent Past Medical, Surgical, Family and Social History were reviewed.        Current Outpatient Medications:     amphetamine-dextroamphetamine (ADDERALL) 20 MG tablet, Take 1 tablet by mouth daily (Patient not taking: Reported on 08/24/2022), Disp: , Rfl:     buPROPion (WELLBUTRIN) 75 MG tablet, , Disp: , Rfl:     Saxenda 18 MG/3ML injection pen, INJECT 06MG  SUBCUTANEOUSLY ONCE A DAY INCREASE BY 06MG  EVERY 7 DAYS AS TOLERATED MAX 3MA/DAY (Patient not taking: Reported on 08/24/2022), Disp: , Rfl:     No Known Allergies    Medications and Allergies reviewed.    PHYSICAL EXAM     Vitals:    08/24/22 1054   BP: 131/84   BP Site: Left arm   Patient Position: Sitting   Cuff Size: Large   Pulse: 89   Resp: 17   Temp: 98.2 F (36.8 C)   SpO2: 99%   Weight: 113.4 kg (250 lb)   Height: 1.727 m (5\' 8" )       Physical Exam  Constitutional:       Appearance: Normal appearance.   HENT:      Head: Normocephalic and  atraumatic.      Right Ear: Tympanic membrane normal.      Nose: Nose normal.      Mouth/Throat:      Mouth: Mucous membranes are moist.     Eyes: Pupils are equal, round, and reactive to light. Cardiovascular:      Rate and Rhythm: Normal rate and regular rhythm.      Pulses: Normal pulses.      Heart sounds: Normal heart sounds.   Pulmonary:      Effort: Pulmonary effort is normal.      Breath sounds: Normal breath sounds.   Neurological:      General: No focal deficit present.      Mental Status: He is alert and oriented to person, place, and time.   Vitals and nursing note reviewed.         UCC COURSE     LABS  The following POCT tests were ordered, reviewed and discussed with the patient/family.     Results       Procedure  Component Value Units Date/Time    QuickVue SARS-COV-2 Antigen POCT [[id number]]  (Normal) Collected: 08/24/22 1125    Specimen: Nasal Swab COVID-19 from [facility] Updated: 08/24/22 1126     QuickVue SARS COV2 Antigen POCT Negative    POCT Influenza A/B [[id number]]  (Normal) Collected: 08/24/22 1125     Updated: 08/24/22 1125     POCT QC Pass     POCT Rapid Influenza A AG Negative     POCT Rapid Influenza B AG Negative    [employer] POCT UA [[id number]]  (Abnormal) Collected: 08/24/22 1124    Specimen: Clean Catch Updated: 08/24/22 1125     Color, UA Yellow     Clarity, UA Clear     Leukocytes UA Negative     Nitrite UA Negative     Urobilinogen UA Negative (0.2 mg/dl)     Protein UA Negative     pH UA 6.0     Blood UA 2+ (80 Ery/ul)     Specific Gravity UA 1.010     Ketone UA Negative     Bilirubin UA Negative     Glucose UA Negative            There were no x-rays reviewed with this patient during the visit.    No current facility-administered medications for this visit.       PROCEDURES     Procedures    MEDICAL DECISION MAKING     History, physical, labs/studies most consistent with Fatigue as the diagnosis.        Chart Review:  Prior PCP, Specialist and/or ED notes reviewed today: No  Prior  labs/images/studies reviewed today: No    Differential Diagnosis: Sinusitis, Bronchitis, Pharyngitis, Pneumonia, Influenza, COVID-19 and Allergic Rhinitis        ASSESSMENT     Encounter Diagnosis   Name Primary?    Fatigue, unspecified type Yes                PLAN      PLAN:   In house tests negative  Well appearing, VSS, non toxic, stable for dispo home  ED precautions reviewed  Follow up with PCP. Referral given.  Patient verbalized understanding of plan of care and had no further questions/concerns              Orders Placed This Encounter   Procedures    Referral to Primary Care    Ambulatory referral to Ambulatory Care Management    QuickVue SARS-COV-2 Antigen POCT    POCT Influenza A/B    [employer] POCT UA     Requested Prescriptions      No prescriptions requested or ordered in this encounter       Discussed results and diagnosis with patient/family.  Reviewed warning signs for worsening condition, as well as, indications for follow-up with primary care physician and return to urgent care clinic.   Patient/family expressed understanding of instructions.     An After Visit Summary was provided to the patient.

## 2022-09-07 ENCOUNTER — Other Ambulatory Visit: Payer: Self-pay

## 2022-09-07 NOTE — Progress Notes (Signed)
Ambulatory Care Management Support:     Care Coordinator received a referral from Valerie Salts, FNP to assist patient with scheduling a PCP appointment after a recent Urgent Care visit.    Care Coordinator attempted to call the patient at [phone] to inquire about assisting him with scheduling a PCP appointment. No one answered and a detailed voicemail was left for the patient to call me back.    Ian Palmer) [facility] Ambulatory Care Management  [facility]   [address] [place], [place] [address]  [place] [phone]

## 2022-09-15 ENCOUNTER — Other Ambulatory Visit: Payer: Self-pay

## 2022-09-15 NOTE — Progress Notes (Signed)
Ambulatory Care Management Support:     Care Coordinator attempted to call the patient a 2nd time at [phone] to inquire about assisting him with scheduling a PCP appointment. No one answered and a detailed voicemail was left for the patient to call me back.     Ian Palmer) [facility], Ambulatory Care Management  [facility]   [address], [place], [place] [address]  [place] [phone]
